# Patient Record
Sex: Male | Born: 2000 | Hispanic: No | Marital: Single | Smoking: Former smoker
Health system: Southern US, Community
[De-identification: ages and names within clinical notes are randomized; demographics above are authoritative.]

---
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
## 2018-07-18 ENCOUNTER — Emergency Department (HOSPITAL_COMMUNITY)
Admission: EM | Admit: 2018-07-18 | Discharge: 2018-07-18 | Disposition: A | Discharge status: Home / Self Care | Payer: Medicaid Other | Attending: Emergency Medicine | Admitting: Emergency Medicine

## 2018-07-18 ENCOUNTER — Other Ambulatory Visit: Payer: Self-pay

## 2018-07-18 ENCOUNTER — Encounter (HOSPITAL_COMMUNITY): Payer: Self-pay | Admitting: Emergency Medicine

## 2018-07-18 DIAGNOSIS — R040 Epistaxis: Secondary | ICD-10-CM | POA: Diagnosis not present

## 2018-07-18 MED ORDER — SILVER NITRATE-POT NITRATE 75-25 % EX MISC
1.0000 | Freq: Once | CUTANEOUS | Status: AC
  Administered 2018-07-18: 1 via TOPICAL
  Filled 2018-07-18: qty 1

## 2018-07-18 NOTE — Discharge Instructions (Addendum)
The site that has been bleeding has been cauterized and should heal, but you will need to be careful as this continues to heal.  Avoid any rigorous nose blowing or trauma to the nose as this site heals.

## 2018-07-18 NOTE — ED Triage Notes (Signed)
Pt states he blew his nose and it started bleeding. Bleeding controlled at this time.

## 2018-07-18 NOTE — ED Notes (Signed)
Pt ambulatory to waiting room. Pt verbalized understanding of discharge instructions.   

## 2018-07-19 NOTE — ED Provider Notes (Signed)
[facility] EMERGENCY DEPARTMENT Provider Note   CSN: [id number] Arrival date & time: 07/18/18  1857     History   Chief Complaint Chief Complaint  Patient presents with  . Epistaxis    HPI Marc Duncan is a 18 y.o. male.  The history is provided by the patient and a parent.  Epistaxis  Location:  R nare Duration:  2 weeks Timing:  Intermittent Progression:  Unchanged Chronicity:  New Context: not anticoagulants, not aspirin use, not bleeding disorder, not foreign body, not nose picking, not recent infection and not trauma   Relieved by:  Applying pressure Worsened by:  Nothing Ineffective treatments:  None tried Associated symptoms: no congestion, no cough, no dizziness, no facial pain, no fever, no headaches, no sinus pain and no sore throat   Risk factors comment:  No known risk factors   History reviewed. No pertinent past medical history.  There are no active problems to display for this patient.   History reviewed. No pertinent surgical history.      Home Medications    Prior to Admission medications   Not on File    Family History No family history on file.  Social History Social History   Tobacco Use  . Smoking status: Never Smoker  . Smokeless tobacco: Never Used  Substance Use Topics  . Alcohol use: No  . Drug use: No     Allergies   Patient has no known allergies.   Review of Systems Review of Systems  Constitutional: Negative for fever.  HENT: Positive for nosebleeds. Negative for congestion, sinus pain and sore throat.   Respiratory: Negative for cough.   Neurological: Negative for dizziness and headaches.     Physical Exam Updated Vital Signs BP 140/69 (BP Location: Right Arm)   Pulse 84   Temp 99 F (37.2 C) (Oral)   Resp 17   Ht 5\' 9"  (1.753 m)   Wt 65.8 kg   SpO2 100%   BMI 21.41 kg/m   Physical Exam Constitutional:      Appearance: He is well-developed.  HENT:     Head: Normocephalic and atraumatic.   Right Ear: Tympanic membrane and ear canal normal.     Left Ear: Tympanic membrane and ear canal normal.     Nose: No nasal deformity, mucosal edema, congestion or rhinorrhea.     Right Nostril: Epistaxis present.     Comments: Small anterior vessel nasal septum right nostril with recent obvious bleeding. Currently hemostatic.    Mouth/Throat:     Pharynx: Uvula midline. No oropharyngeal exudate or posterior oropharyngeal erythema.     Tonsils: No tonsillar abscesses.  Eyes:     Conjunctiva/sclera: Conjunctivae normal.  Cardiovascular:     Rate and Rhythm: Normal rate.  Pulmonary:     Effort: Pulmonary effort is normal.  Musculoskeletal: Normal range of motion.  Skin:    General: Skin is warm and dry.     Findings: No rash.  Neurological:     Mental Status: He is alert and oriented to person, place, and time.      ED Treatments / Results  Labs (all labs ordered are listed, but only abnormal results are displayed) Labs Reviewed - No data to display  EKG None  Radiology No results found.  Procedures .Epistaxis Management Date/Time: 07/18/2018 9:09 PM Performed by: Burgess AmorIdol, Kohan Azizi, PA-C Authorized by: Burgess AmorIdol, Brax Walen, PA-C   Consent:    Consent obtained:  Verbal   Consent given by:  Patient and  parent   Risks discussed:  Bleeding and pain   Alternatives discussed:  No treatment and alternative treatment Procedure details:    Treatment site:  R anterior   Treatment method:  Silver nitrate   Treatment complexity:  Limited   Treatment episode: initial   Post-procedure details:    Assessment:  Bleeding stopped   Patient tolerance of procedure:  Tolerated well, no immediate complications   (including critical care time)  Medications Ordered in ED Medications  silver nitrate applicators applicator 1 Stick (1 Stick Topical Given by Other 07/18/18 2119)     Initial Impression / Assessment and Plan / ED Course  I have reviewed the triage vital signs and the nursing  notes.  Pertinent labs & imaging results that were available during my care of the patient were reviewed by me and considered in my medical decision making (see chart for details).     Home care instructions given along with instructions for proper technique for future nosebleeds.  Prn f/u anticipated.  Final Clinical Impressions(s) / ED Diagnoses   Final diagnoses:  Right-sided epistaxis    ED Discharge Orders    None       Victoriano Lain 07/19/18 1410    Bethann Berkshire, MD 07/21/18 1113

## 2018-07-30 ENCOUNTER — Emergency Department (HOSPITAL_COMMUNITY)
Admission: EM | Admit: 2018-07-30 | Discharge: 2018-07-30 | Disposition: A | Discharge status: Home / Self Care | Payer: Medicaid Other | Attending: Emergency Medicine | Admitting: Emergency Medicine

## 2018-07-30 ENCOUNTER — Encounter (HOSPITAL_COMMUNITY): Payer: Self-pay | Admitting: Emergency Medicine

## 2018-07-30 ENCOUNTER — Other Ambulatory Visit: Payer: Self-pay

## 2018-07-30 ENCOUNTER — Emergency Department (HOSPITAL_COMMUNITY): Payer: Medicaid Other

## 2018-07-30 DIAGNOSIS — R0602 Shortness of breath: Secondary | ICD-10-CM | POA: Diagnosis not present

## 2018-07-30 NOTE — ED Triage Notes (Signed)
Pt states he has been having trouble breathing since 10pm last night.

## 2018-07-30 NOTE — Discharge Instructions (Addendum)
Stop using juul or any smokeless tobacco.  Recheck if you get a cough, fever, or you are struggling to breathe.

## 2018-07-30 NOTE — ED Provider Notes (Signed)
[facility] EMERGENCY DEPARTMENT Provider Note   CSN: [id number] Arrival date & time: 07/30/18  0234  Time seen 3:47 AM   History   Chief Complaint Chief Complaint  Patient presents with  . Shortness of Breath    HPI PETAR REDDER is a 18 y.o. male.  HPI patient states he was fine all day.  However about 10 PM he felt like he was having trouble breathing.  He states he felt like it was from his chest and not from having a stuffy or clogged up nose.  He denies cough, nasal congestion, fever, wheezing, palpitations, feeling lightheaded or dizzy, having pleuritic chest pain.  He states laying down seem to make it worse and sitting up seem to make it feel better.  He states it lasted about 3 hours.  He states he felt like he could not in take a big deep breath.  He states he is never had this happen before.  Patient states he has been getting on and off smoking Juuls, he is currently been using it for the past week.  He states he does use pineapple flavor.  The last time he smoked was at 2 PM, several hours before he felt short of breath.  [facility] Personal   History reviewed. No pertinent past medical history.  There are no active problems to display for this patient.   History reviewed. No pertinent surgical history.      Home Medications    Prior to Admission medications   Not on File    Family History No family history on file.  Social History Social History   Tobacco Use  . Smoking status: Never Smoker  . Smokeless tobacco: Never Used  Substance Use Topics  . Alcohol use: No  . Drug use: No     Allergies   Patient has no known allergies.   Review of Systems Review of Systems  All other systems reviewed and are negative.    Physical Exam Updated Vital Signs BP (!) 138/97 (BP Location: Left Arm)   Pulse 75   Temp 97.8 F (36.6 C) (Oral)   Resp 18   Ht 5\' 9"  (1.753 m)   Wt 66.2 kg   SpO2 96%   BMI 21.56 kg/m    Vital signs normal     Physical Exam Vitals signs and nursing note reviewed.  Constitutional:      General: He is not in acute distress.    Appearance: Normal appearance. He is well-developed. He is not ill-appearing or toxic-appearing.  HENT:     Head: Normocephalic and atraumatic.     Right Ear: External ear normal.     Left Ear: External ear normal.     Nose: Nose normal. No mucosal edema or rhinorrhea.     Mouth/Throat:     Mouth: Mucous membranes are moist.     Dentition: No dental abscesses.     Pharynx: Uvula midline. No pharyngeal swelling, oropharyngeal exudate, posterior oropharyngeal erythema or uvula swelling.     Comments: Voice normal Eyes:     Conjunctiva/sclera: Conjunctivae normal.     Pupils: Pupils are equal, round, and reactive to light.  Neck:     Musculoskeletal: Full passive range of motion without pain, normal range of motion and neck supple.  Cardiovascular:     Rate and Rhythm: Normal rate and regular rhythm.     Heart sounds: Normal heart sounds. No murmur. No friction rub. No gallop.  Pulmonary:     Effort: Pulmonary effort is normal. No respiratory distress.     Breath sounds: Normal breath sounds. No wheezing, rhonchi or rales.  Chest:     Chest wall: No tenderness or crepitus.  Abdominal:     General: Bowel sounds are normal. There is no distension.     Palpations: Abdomen is soft.     Tenderness: There is no abdominal tenderness. There is no guarding or rebound.  Musculoskeletal: Normal range of motion.        General: No tenderness.     Comments: Moves all extremities well.   Skin:    General: Skin is warm and dry.     Coloration: Skin is not pale.     Findings: No erythema or rash.  Neurological:     General: No focal deficit present.     Mental Status: He is alert and oriented to person, place, and time.     Cranial Nerves: No cranial nerve deficit.  Psychiatric:        Mood and Affect: Mood normal. Mood is not anxious.         Speech: Speech normal.        Behavior: Behavior normal.        Thought Content: Thought content normal.      ED Treatments / Results  Labs (all labs ordered are listed, but only abnormal results are displayed) Labs Reviewed - No data to display  EKG None  Radiology Dg Chest 2 View  Result Date: 07/30/2018 CLINICAL DATA:  Trouble breathing since last night EXAM: CHEST - 2 VIEW COMPARISON:  None. FINDINGS: Normal heart size and mediastinal contours. No acute infiltrate or edema. No effusion or pneumothorax. No acute osseous findings. IMPRESSION: Negative chest. Electronically Signed   By: Marnee Spring M.D.   On: 07/30/2018 05:29    Procedures Procedures (including critical care time)  Medications Ordered in ED Medications - No data to display   Initial Impression / Assessment and Plan / ED Course  I have reviewed the triage vital signs and the nursing notes.  Pertinent labs & imaging results that were available during my care of the patient were reviewed by me and considered in my medical decision making (see chart for details).     Chest x-ray was done due to his using the Juuls, and I am going to have nursing staff ambulate him to make sure he does not become hypoxic.  Does report patient ambulated and maintain his pulse ox at 100%.  5:30 AM patient's chest x-ray has resulted and is normal.  Patient currently is improved.  I am not sure what caused his shortness of breath however he will be encouraged to stop using the smokeless nicotine.  Final Clinical Impressions(s) / ED Diagnoses   Final diagnoses:  Shortness of breath    ED Discharge Orders    None      Plan discharge  Devoria Albe, MD, Concha Pyo, MD 07/30/18 [id number]

## 2018-07-30 NOTE — ED Notes (Signed)
Pt ambulated well with o2 sat at 100%

## 2020-04-12 IMAGING — DX DG CHEST 2V
2 series · 2 of 2 positions shown · non-contrast
Comparison: None.

CLINICAL DATA: Trouble breathing since last night

EXAM:
CHEST - 2 VIEW

[chest pa]
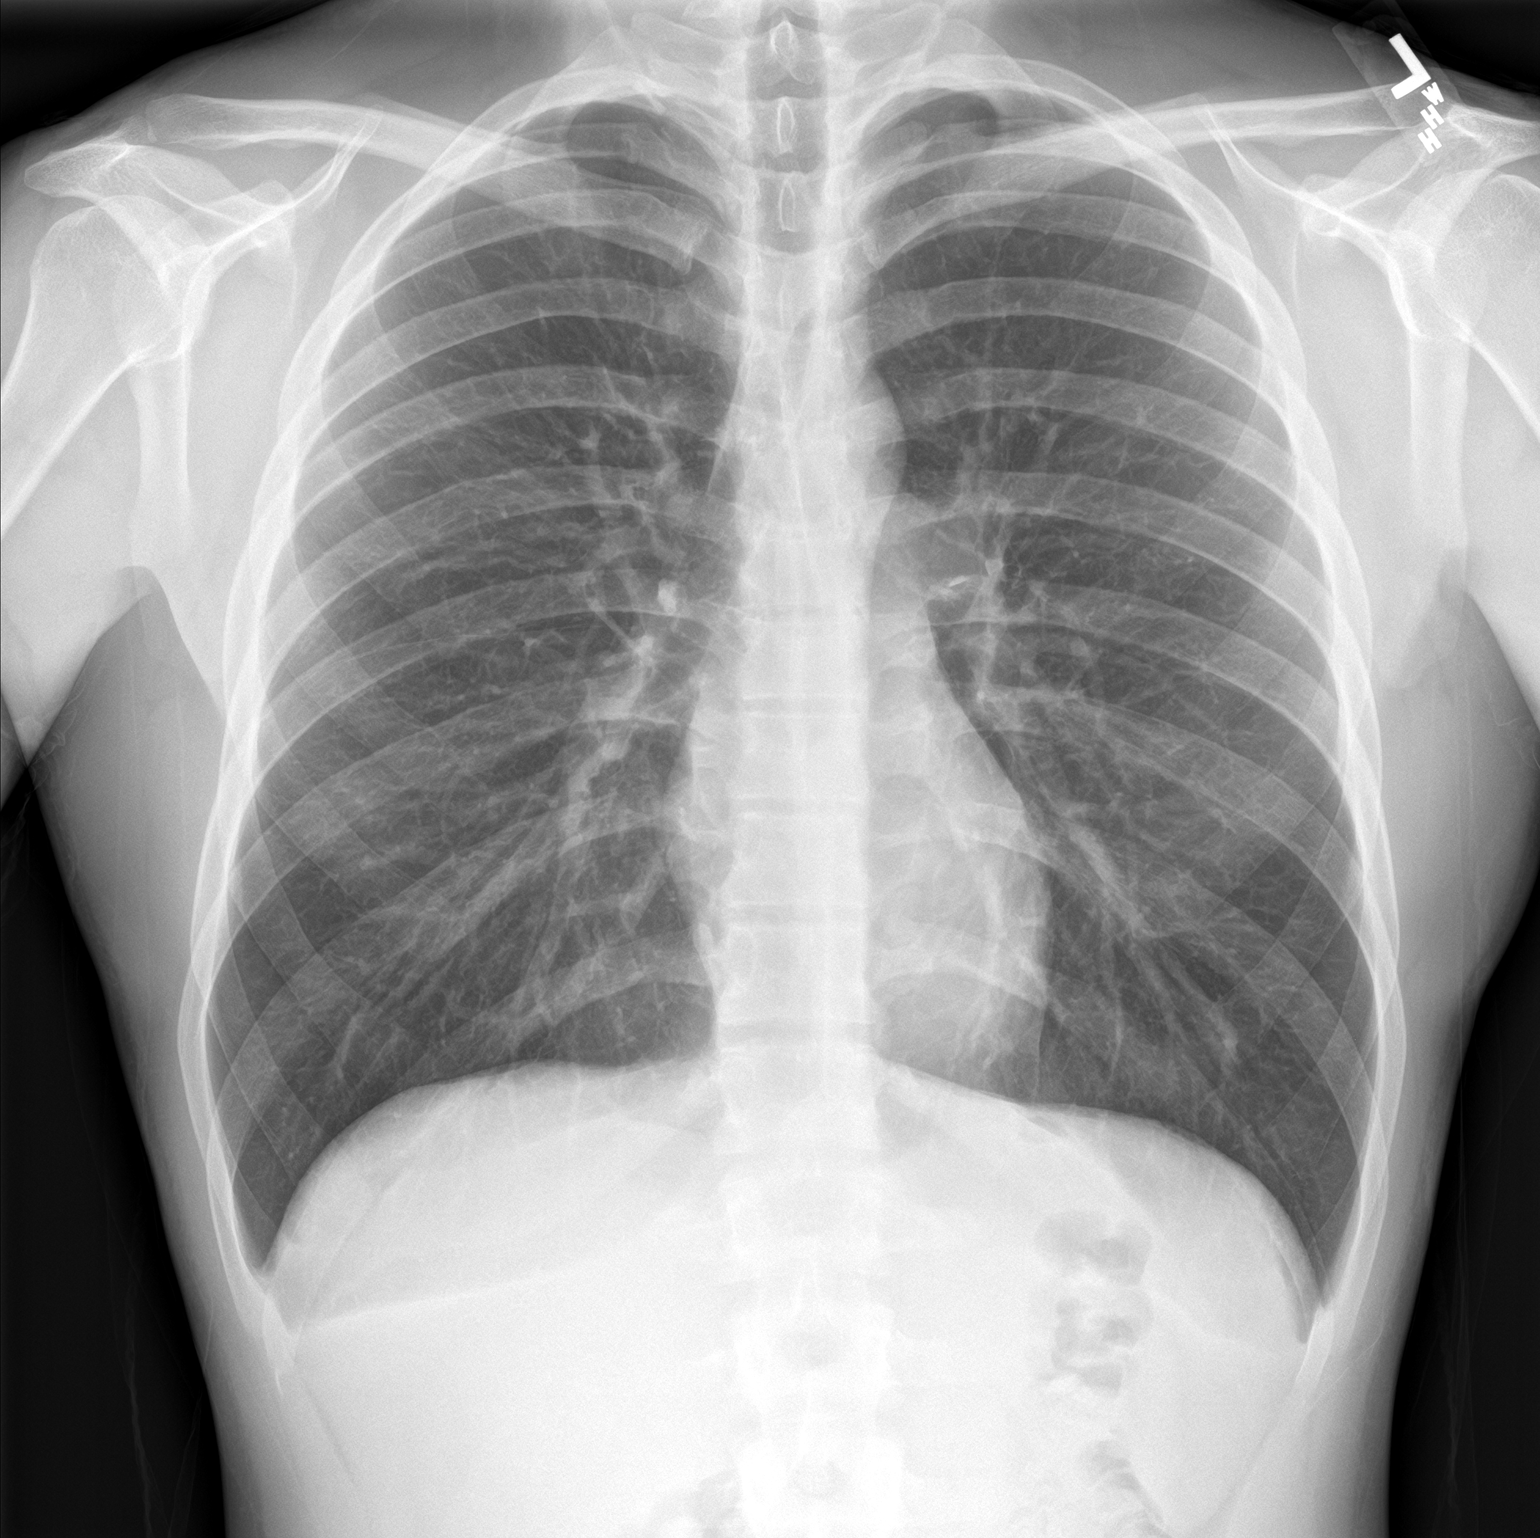

[chest lat]
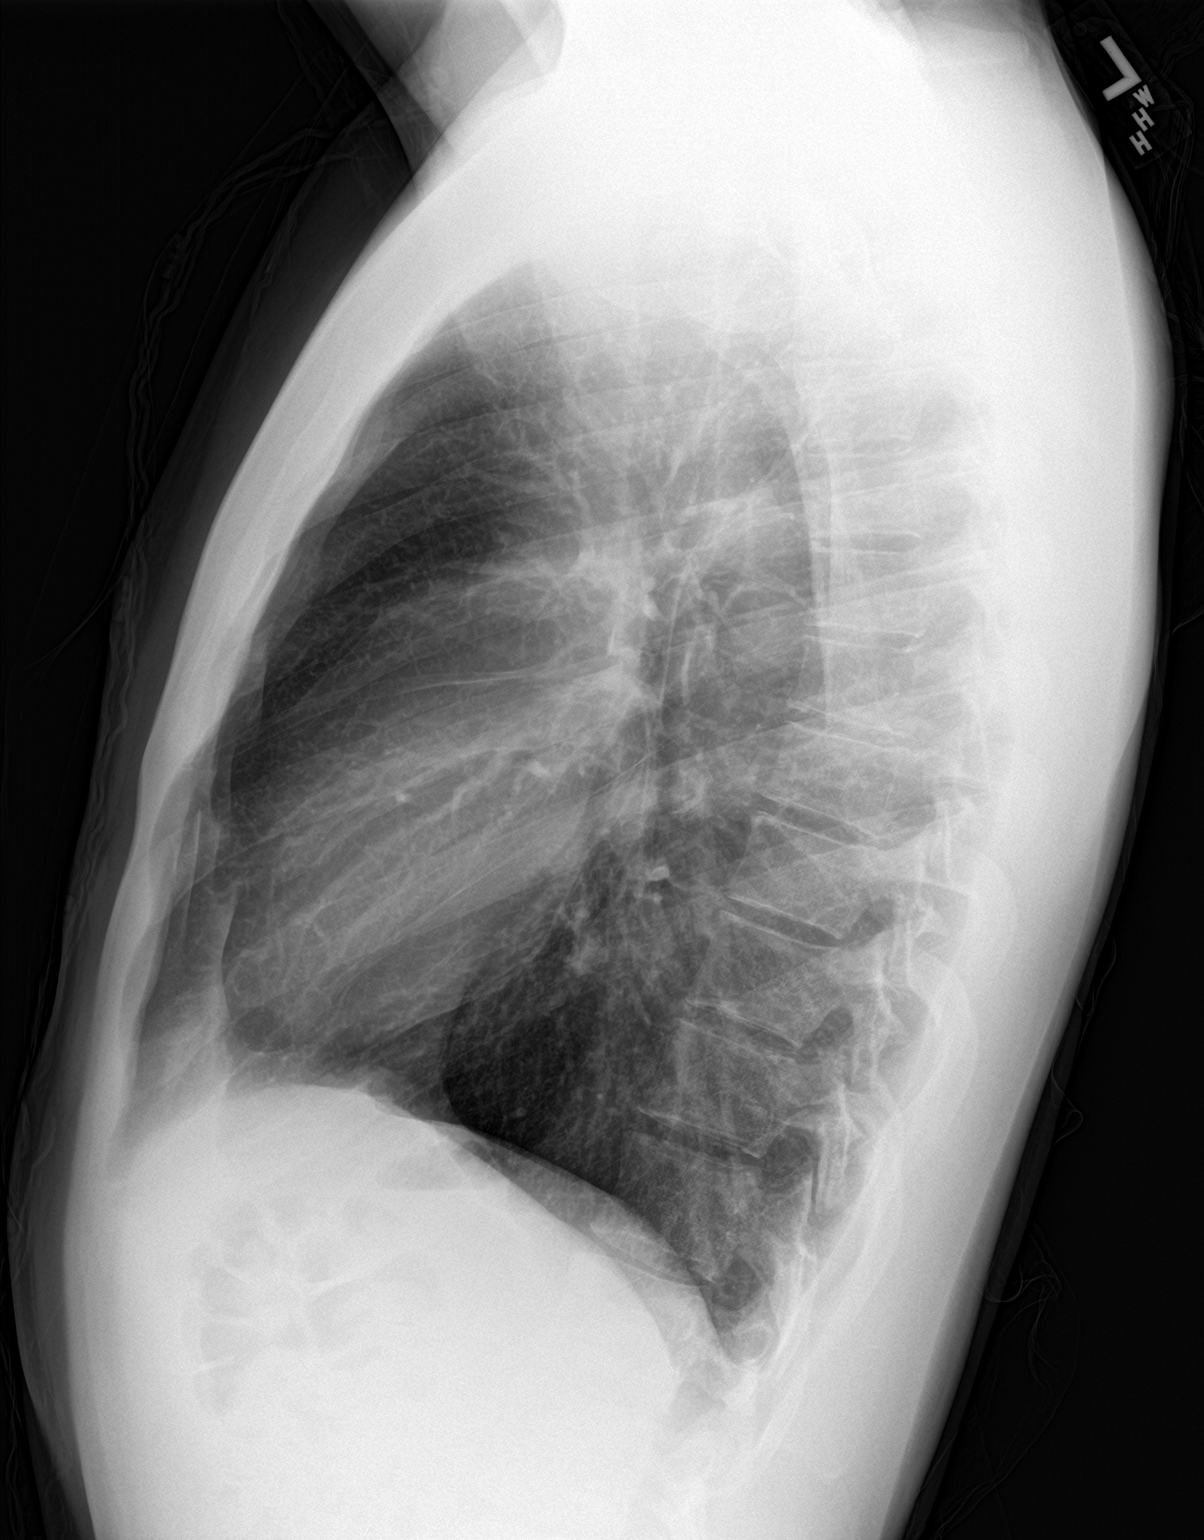

[2 of 2 positions shown; findings below may reference images not displayed]

FINDINGS: Normal heart size and mediastinal contours. No acute infiltrate or
edema. No effusion or pneumothorax. No acute osseous findings.
IMPRESSION: Negative chest.

## 2020-10-26 ENCOUNTER — Other Ambulatory Visit: Payer: Self-pay

## 2020-10-26 ENCOUNTER — Ambulatory Visit (INDEPENDENT_AMBULATORY_CARE_PROVIDER_SITE_OTHER): Payer: Medicaid Other | Admitting: Urology

## 2020-10-26 ENCOUNTER — Encounter: Payer: Self-pay | Admitting: Urology

## 2020-10-26 VITALS — BP 133/75 | HR 80 | Temp 98.0°F | Ht 70.0 in | Wt 170.0 lb

## 2020-10-26 DIAGNOSIS — N453 Epididymo-orchitis: Secondary | ICD-10-CM

## 2020-10-26 LAB — URINALYSIS, ROUTINE W REFLEX MICROSCOPIC
Bilirubin, UA: NEGATIVE
Glucose, UA: NEGATIVE
Ketones, UA: NEGATIVE
Leukocytes,UA: NEGATIVE
Nitrite, UA: NEGATIVE
Protein,UA: NEGATIVE
RBC, UA: NEGATIVE
Specific Gravity, UA: 1.025 (ref 1.005–1.030)
Urobilinogen, Ur: 0.2 mg/dL (ref 0.2–1.0)
pH, UA: 6 (ref 5.0–7.5)

## 2020-10-26 MED ORDER — SULFAMETHOXAZOLE-TRIMETHOPRIM 800-160 MG PO TABS: 1.0000 | ORAL_TABLET | Freq: Two times a day (BID) | ORAL | 0 refills | Status: AC

## 2020-10-26 NOTE — Progress Notes (Signed)
10/26/2020 9:00 AM   Marc Duncan [birth year] [id number]  Referring provider: Health, [facility] Dept Personal [address] [place],  [place] [address]  Right testis pain  HPI: Mr Bellis as a 20yo here for evaluation of right testis pain. His right testis pain has been present since mid March. He presented to the ER on 3/28 with worsening right testis pain. He was treated with rocephin and doxycycline which failed to improve his right testis pain. He had associated dysuria at the time which improved with the doxycycline.  STD screen was negative. No known trauma. His pain is dull ache, intermittent, mild to moderate and nonraditing. Pain occurs after masterbation and can last 30 minutes to several hours. NO LUTS. No dysuria. Warm Sitz baths help the pain.  IPSS 2 QOL 1.    PMH: History reviewed. No pertinent past medical history.  Surgical History: History reviewed. No pertinent surgical history.  Home Medications:  Allergies as of 10/26/2020   No Known Allergies     Medication List       Accurate as of Oct 26, 2020  9:00 AM. If you have any questions, ask your nurse or doctor.        doxycycline 100 MG capsule Commonly known as: VIBRAMYCIN Take 100 mg by mouth 2 (two) times daily.       Allergies: No Known Allergies  Family History: History reviewed. No pertinent family history.  Social History:  reports that he quit smoking 2 days ago. He smoked 0.25 packs per day. He has never used smokeless tobacco. He reports that he does not drink alcohol and does not use drugs.  ROS: All other review of systems were reviewed and are negative except what is noted above in HPI  Physical Exam: BP 133/75   Pulse 80   Temp 98 F (36.7 C)   Ht 5\' 10"  (1.778 m)   Wt 170 lb (77.1 kg)   BMI 24.39 kg/m   Constitutional:  Alert and oriented, No acute distress. HEENT: [place] AT, moist mucus membranes.  Trachea midline, no masses. Cardiovascular: No clubbing, cyanosis, or  edema. Respiratory: Normal respiratory effort, no increased work of breathing. GI: Abdomen is soft, nontender, nondistended, no abdominal masses GU: No CVA tenderness. Circumcised phallus. No masses/lesions on penis, testis, scrotum. Lymph: No cervical or inguinal lymphadenopathy. Skin: No rashes, bruises or suspicious lesions. Neurologic: Grossly intact, no focal deficits, moving all 4 extremities. Psychiatric: Normal mood and affect.  Laboratory Data: No results found for: WBC, HGB, HCT, MCV, PLT  No results found for: CREATININE  No results found for: PSA  No results found for: TESTOSTERONE  No results found for: HGBA1C  Urinalysis No results found for: COLORURINE, APPEARANCEUR, LABSPEC, PHURINE, GLUCOSEU, HGBUR, BILIRUBINUR, KETONESUR, PROTEINUR, UROBILINOGEN, NITRITE, LEUKOCYTESUR  No results found for: LABMICR, WBCUA, RBCUA, LABEPIT, MUCUS, BACTERIA  Pertinent Imaging: Scrotal 09/14/2020: Images reviewed and discussed with the patient No results found for this or any previous visit.  No results found for this or any previous visit.  No results found for this or any previous visit.  No results found for this or any previous visit.  No results found for this or any previous visit.  No results found for this or any previous visit.  No results found for this or any previous visit.  No results found for this or any previous visit.   Assessment & Plan:    1. Chronic prostatitis -bactrim DS BID for 28 days -  Urinalysis, Routine w reflex microscopic   No follow-ups on file.  Nicolette Bang, MD  [facility] [place]

## 2020-10-26 NOTE — Progress Notes (Signed)
Urological Symptom Review  Patient is experiencing the following symptoms: Testicle pain   Review of Systems  Gastrointestinal (upper)  : Negative for upper GI symptoms  Gastrointestinal (lower) : Negative for lower GI symptoms  Constitutional : Negative for symptoms  Skin: Negative for skin symptoms  Eyes: Negative for eye symptoms  Ear/Nose/Throat : Negative for Ear/Nose/Throat symptoms  Hematologic/Lymphatic: Negative for Hematologic/Lymphatic symptoms  Cardiovascular : Negative for cardiovascular symptoms  Respiratory : Negative for respiratory symptoms  Endocrine: Negative for endocrine symptoms  Musculoskeletal: Negative for musculoskeletal symptoms  Neurological: Negative for neurological symptoms  Psychologic: Negative for psychiatric symptoms

## 2020-10-26 NOTE — Patient Instructions (Signed)

## 2020-12-11 ENCOUNTER — Other Ambulatory Visit: Payer: Self-pay

## 2020-12-11 ENCOUNTER — Ambulatory Visit (INDEPENDENT_AMBULATORY_CARE_PROVIDER_SITE_OTHER): Payer: Medicaid Other | Admitting: Urology

## 2020-12-11 ENCOUNTER — Encounter: Payer: Self-pay | Admitting: Urology

## 2020-12-11 VITALS — BP 118/70 | HR 86

## 2020-12-11 DIAGNOSIS — N453 Epididymo-orchitis: Secondary | ICD-10-CM

## 2020-12-11 LAB — URINALYSIS, ROUTINE W REFLEX MICROSCOPIC
Bilirubin, UA: NEGATIVE
Glucose, UA: NEGATIVE
Ketones, UA: NEGATIVE
Leukocytes,UA: NEGATIVE
Nitrite, UA: NEGATIVE
Protein,UA: NEGATIVE
RBC, UA: NEGATIVE
Specific Gravity, UA: 1.025 (ref 1.005–1.030)
Urobilinogen, Ur: 0.2 mg/dL (ref 0.2–1.0)
pH, UA: 5.5 (ref 5.0–7.5)

## 2020-12-11 NOTE — Progress Notes (Signed)
   12/11/2020 9:47 AM   Marc Duncan 06-03-2001 [id number]  Referring provider: Health, [facility] Dept Personal [address] [place],  [place] [address]  Followup right orchalgia  HPI: Marc Duncan is a 20yo here for followup epididymo-orchitis and right orchalgia. His pain slightly improved after bactrim for 28 days. The pain is intermittent and is not related to activity. The pain is sharpo and mild and lasts 60 minutes. Rest improves the pain. NO other complaints today   PMH: No past medical history on file.  Surgical History: No past surgical history on file.  Home Medications:  Allergies as of 12/11/2020   No Known Allergies      Medication List        Accurate as of December 11, 2020  9:47 AM. If you have any questions, ask your nurse or doctor.          doxycycline 100 MG capsule Commonly known as: VIBRAMYCIN Take 100 mg by mouth 2 (two) times daily.   sulfamethoxazole-trimethoprim 800-160 MG tablet Commonly known as: BACTRIM DS Take 1 tablet by mouth 2 (two) times daily.        Allergies: No Known Allergies  Family History: No family history on file.  Social History:  reports that he quit smoking about 6 weeks ago. He smoked an average of 0.25 packs per day. He has never used smokeless tobacco. He reports that he does not drink alcohol and does not use drugs.  ROS: All other review of systems were reviewed and are negative except what is noted above in HPI  Physical Exam: BP 118/70   Pulse 86   Constitutional:  Alert and oriented, No acute distress. HEENT: [place] AT, moist mucus membranes.  Trachea midline, no masses. Cardiovascular: No clubbing, cyanosis, or edema. Respiratory: Normal respiratory effort, no increased work of breathing. GI: Abdomen is soft, nontender, nondistended, no abdominal masses GU: No CVA tenderness.  Lymph: No cervical or inguinal lymphadenopathy. Skin: No rashes, bruises or suspicious lesions. Neurologic: Grossly  intact, no focal deficits, moving all 4 extremities. Psychiatric: Normal mood and affect.  Laboratory Data: No results found for: WBC, HGB, HCT, MCV, PLT  No results found for: CREATININE  No results found for: PSA  No results found for: TESTOSTERONE  No results found for: HGBA1C  Urinalysis    Component Value Date/Time   APPEARANCEUR Clear 10/26/2020 1146   GLUCOSEU Negative 10/26/2020 1146   BILIRUBINUR Negative 10/26/2020 1146   PROTEINUR Negative 10/26/2020 1146   NITRITE Negative 10/26/2020 1146   LEUKOCYTESUR Negative 10/26/2020 1146    Lab Results  Component Value Date   LABMICR Comment 10/26/2020    Pertinent Imaging:  No results found for this or any previous visit.  No results found for this or any previous visit.  No results found for this or any previous visit.  No results found for this or any previous visit.  No results found for this or any previous visit.  No results found for this or any previous visit.  No results found for this or any previous visit.  No results found for this or any previous visit.   Assessment & Plan:    1. Orchitis and epididymitis -improved after antibiotic therapy, He was instructed to take 600-800mg  BID PRN for the pain. RTC 6 months - Urinalysis, Routine w reflex microscopic   No follow-ups on file.  Wilkie Aye, MD  [facility] [place]

## 2020-12-11 NOTE — Progress Notes (Signed)

## 2020-12-11 NOTE — Patient Instructions (Signed)
Epididymitis  Epididymitis is swelling (inflammation) or infection of the epididymis. The epididymis is a cord-like structure that is located along the top and back part of the testicle. It collects and storessperm from the testicle. This condition can also cause pain and swelling of the testicle and scrotum. Symptoms usually start suddenly (acute epididymitis). Sometimes epididymitis starts gradually and lasts for a while (chronic epididymitis). This type may be harder to treat. What are the causes? In men ages 20-40, this condition is usually caused by a bacterial infection or a sexually transmitted disease (STD), such as: Gonorrhea. Chlamydia. In men 40 and older who do not have anal sex, this condition is usually caused by bacteria from a blockage or from abnormalities in the urinary system. These can result from: Having a tube placed into the bladder (urinary catheter). Having an enlarged or inflamed prostate gland. Having recently had urinary tract surgery. Having a problem with a backward flow of urine (retrograde). In men who have a condition that weakens the body's defense system (immune system), such as HIV, this condition can be caused by: Other bacteria, including tuberculosis and syphilis. Viruses. Fungi. Sometimes this condition occurs without infection. This may happen because oftrauma or repetitive activities such as sports. What increases the risk? You are more likely to develop this condition if you have: Unprotected sex with more than one partner. Anal sex. Recently had surgery. A urinary catheter. Urinary problems. A suppressed immune system. What are the signs or symptoms? This condition usually begins suddenly with chills, fever, and pain behind the scrotum and in the testicle. Other symptoms include: Swelling of the scrotum, testicle, or both. Pain when ejaculating or urinating. Pain in the back or abdomen. Nausea. Itching and discharge from the penis. A  frequent need to pass urine. Redness, increased warmth, and tenderness of the scrotum. How is this diagnosed? Your health care provider can diagnose this condition based on your symptoms and medical history. Your health care provider will also do a physical exam to ask about your symptoms and check your scrotum and testicle for swelling, pain, and redness. You may also have other tests, including: Examination of discharge from the penis. Urine tests for infections, such as STDs. Ultrasound test for blood flow and inflammation. Your health care provider may test you for other STDs, including HIV. How is this treated? Treatment for this condition depends on the cause. If your condition is caused by a bacterial infection, oral antibiotic medicine may be prescribed. If the bacterial infection has spread to your blood, you may need to receive IVantibiotics. For both bacterial and nonbacterial epididymitis, you may be treated with: Rest. Elevation of the scrotum. Pain medicines. Anti-inflammatory medicines. Surgery may be needed to treat: Bacterial epididymitis that causes pus to build up in the scrotum (abscess). Chronic epididymitis that has not responded to other treatments. Follow these instructions at home: Medicines Take over-the-counter and prescription medicines only as told by your health care provider. If you were prescribed an antibiotic medicine, take it as told by your health care provider. Do not stop taking the antibiotic even if your condition improves. Sexual activity If your epididymitis was caused by an STD, avoid sexual activity until your treatment is complete. Inform your sexual partner or partners if you test positive for an STD. They may need to be treated. Do not engage in sexual activity with your partner or partners until their treatment is completed. Managing pain and swelling  If directed, elevate your scrotum and apply ice. Put   ice in a plastic bag. Place a small  towel or pillow between your legs. Rest your scrotum on the pillow or towel. Place another towel between your skin and the plastic bag. Leave the ice on for 20 minutes, 2-3 times a day. Try taking a sitz bath to help with discomfort. This is a warm water bath that is taken while you are sitting down. The water should only come up to your hips and should cover your buttocks. Do this 3-4 times per day or as told by your health care provider. Keep your scrotum elevated and supported while resting. Ask your health care provider if you should wear a scrotal support, such as a jockstrap. Wear it as told by your health care provider.  General instructions Return to your normal activities as told by your health care provider. Ask your health care provider what activities are safe for you. Drink enough fluid to keep your urine pale yellow. Keep all follow-up visits as told by your health care provider. This is important. Contact a health care provider if: You have a fever. Your pain medicine is not helping. Your pain is getting worse. Your symptoms do not improve within 3 days. Summary Epididymitis is swelling (inflammation) or infection of the epididymis. This condition can also cause pain and swelling of the testicle and scrotum. Treatment for this condition depends on the cause. If your condition is caused by a bacterial infection, oral antibiotic medicine may be prescribed. Inform your sexual partner or partners if you test positive for an STD. They may need to be treated. Do not engage in sexual activity with your partner or partners until their treatment is completed. Contact a health care provider if your symptoms do not improve within 3 days. This information is not intended to replace advice given to you by your health care provider. Make sure you discuss any questions you have with your healthcare provider. Document Revised: 04/09/2018 Document Reviewed: 04/10/2018 [employer] Patient Education   2022 [employer]  

## 2021-06-09 ENCOUNTER — Ambulatory Visit: Payer: Medicaid Other | Admitting: Urology
